# Patient Record
Sex: Male | Born: 2005 | Race: Black or African American | Hispanic: No | Marital: Single | Smoking: Never smoker
Health system: Southern US, Community
[De-identification: ages and names within clinical notes are randomized; demographics above are authoritative.]

## PROBLEM LIST (undated history)

## (undated) DIAGNOSIS — J302 Other seasonal allergic rhinitis: Secondary | ICD-10-CM

## (undated) DIAGNOSIS — L309 Dermatitis, unspecified: Secondary | ICD-10-CM

## (undated) HISTORY — DX: Dermatitis, unspecified: L30.9

## (undated) HISTORY — DX: Other seasonal allergic rhinitis: J30.2

---
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
## 2023-07-14 ENCOUNTER — Telehealth (INDEPENDENT_AMBULATORY_CARE_PROVIDER_SITE_OTHER): Payer: Self-pay | Admitting: Primary Care

## 2023-07-14 NOTE — Telephone Encounter (Signed)
 Called pt to remind them about apt. Pt will be present

## 2023-07-15 ENCOUNTER — Other Ambulatory Visit (HOSPITAL_COMMUNITY)
Admission: RE | Admit: 2023-07-15 | Discharge: 2023-07-15 | Disposition: A | Discharge status: Home / Self Care | Payer: Managed Care, Other (non HMO) | Source: Ambulatory Visit | Attending: Primary Care | Admitting: Primary Care

## 2023-07-15 ENCOUNTER — Encounter (INDEPENDENT_AMBULATORY_CARE_PROVIDER_SITE_OTHER): Payer: Self-pay | Admitting: Primary Care

## 2023-07-15 ENCOUNTER — Ambulatory Visit (INDEPENDENT_AMBULATORY_CARE_PROVIDER_SITE_OTHER): Payer: Managed Care, Other (non HMO) | Admitting: Primary Care

## 2023-07-15 VITALS — BP 120/72 | HR 64 | Resp 16 | Ht 71.0 in | Wt 140.8 lb

## 2023-07-15 DIAGNOSIS — Z23 Encounter for immunization: Secondary | ICD-10-CM

## 2023-07-15 DIAGNOSIS — Z113 Encounter for screening for infections with a predominantly sexual mode of transmission: Secondary | ICD-10-CM | POA: Diagnosis not present

## 2023-07-15 DIAGNOSIS — R1033 Periumbilical pain: Secondary | ICD-10-CM

## 2023-07-15 DIAGNOSIS — Z2821 Immunization not carried out because of patient refusal: Secondary | ICD-10-CM

## 2023-07-15 DIAGNOSIS — Z7689 Persons encountering health services in other specified circumstances: Secondary | ICD-10-CM | POA: Diagnosis not present

## 2023-07-15 DIAGNOSIS — K219 Gastro-esophageal reflux disease without esophagitis: Secondary | ICD-10-CM

## 2023-07-15 MED ORDER — METOCLOPRAMIDE HCL 10 MG PO TABS: 10.0000 mg | ORAL_TABLET | Freq: Three times a day (TID) | ORAL | 0 refills | Status: AC | PRN

## 2023-07-15 NOTE — Progress Notes (Signed)
New Patient Office Visit  Subjective    Patient ID: Charles Walter male  DOB: 03-25-2006  Age: 17 y.o. MRN: [id number]   CC: abdominal pain   Charles Walter is a 17 year old male who is sexually active with the same male for the last 2 years.  Asked patient what was use for birth control he states she is on the pill.  This is when the conversation starts-the pill that is only active if you remember to take it daily, the pill is also decreased in effectiveness if on an antibiotic.  He states she has an alarm to take it every morning at 10:30 AM.  Explained to patient patient this is very good however condoms is also an additional option for birth control and decreases the risk for sexually transmitted diseases.  Patient is aware of all discussed and is in agreement.  Gastroesophageal Reflux He complains of abdominal pain and heartburn. This is a chronic problem. The current episode started more than 1 year ago. The problem occurs frequently. The problem has been waxing and waning. The heartburn duration is several minutes. The heartburn is located in the substernum and left chest. The heartburn is of mild intensity. The heartburn does not wake him from sleep. The heartburn does not limit his activity. The heartburn changes with position. The symptoms are aggravated by smoking, caffeine and certain foods (constipation). Risk factors include smoking/tobacco exposure and NSAIDs. He has tried nothing for the symptoms. The treatment provided mild relief.      Current Outpatient Medications on File Prior to Visit  Medication Sig Dispense Refill   fluticasone (FLONASE) 50 MCG/ACT nasal spray Place 1 spray into both nostrils daily. 16 g 6   loratadine (CLARITIN) 10 MG tablet Take 1 tablet (10 mg total) by mouth daily.     No current facility-administered medications on file prior to visit.     No Known Allergies  Past Medical History:  Diagnosis Date   Eczema    Seasonal allergies       No past surgical history on file.   Family History  Problem Relation Age of Onset   Asthma Brother    Hypertension Maternal Grandmother     Social History   Socioeconomic History   Marital status: Single    Spouse name: Not on file   Number of children: Not on file   Years of education: Not on file   Highest education level: Not on file  Occupational History   Not on file  Tobacco Use   Smoking status: Never   Smokeless tobacco: Never  Substance and Sexual Activity   Alcohol use: Never    Alcohol/week: 0.0 standard drinks of alcohol   Drug use: Never   Sexual activity: Never  Other Topics Concern   Not on file  Social History Narrative   Lives with mother and brothers; Charles Walter and Charles Walter.    Social Drivers of Corporate investment banker Strain: Not on file  Food Insecurity: Not on file  Transportation Needs: Not on file  Physical Activity: Not on file  Stress: Not on file  Social Connections: Not on file  Intimate Partner Violence: Not on file       Health Maintenance  Topic Date Due   HPV Vaccine (1 - Male 3-dose series) Never done   Hepatitis C Screening  Never done   COVID-19 Vaccine (1 - 2024-25 season) Never done   Flu Shot  10/27/2023*  DTaP/Tdap/Td vaccine (2 - Td or Tdap) 04/04/2026   HIV Screening  Completed  *Topic was postponed. The date shown is not the original due date.    Objective    BP 120/72   Pulse 64   Resp 16   Ht 5\' 11"  (1.803 m)   Wt 140 lb 12.8 oz (63.9 kg)   SpO2 96%   BMI 19.64 kg/m   Physical exam: General: Vital signs reviewed.  Patient is well-developed and well-nourished, male in no acute distress and cooperative with exam. Head: Normocephalic and atraumatic. Eyes: EOMI, conjunctivae normal, no scleral icterus. Neck: Supple, trachea midline, normal ROM, no JVD, masses, thyromegaly, or carotid bruit present. Cardiovascular: RRR, S1 normal, S2 normal, no murmurs, gallops, or rubs. Pulmonary/Chest:  Clear to auscultation bilaterally, no wheezes, rales, or rhonchi. Abdominal: Soft, non-tender, non-distended, BS +, no masses, organomegaly, or guarding present. Musculoskeletal: No joint deformities, erythema, or stiffness, ROM full and nontender. Extremities: No lower extremity edema bilaterally,  pulses symmetric and intact bilaterally. No cyanosis or clubbing. Neurological: A&O x3, Strength is normal Skin: Warm, dry and intact. No rashes or erythema. Psychiatric: Normal mood and affect. speech and behavior is normal. Cognition and memory are normal.     Assessment & Plan:   Charles Walter was seen today for new patient (initial visit) and gastroesophageal reflux.  Diagnoses and all orders for this visit:  Screening examination for STD (sexually transmitted disease)  Influenza vaccination declined     Follow-up:  Return in about 3 months (around 10/13/2023) for medical conditions.  The above assessment and management plan was discussed with the patient. The patient verbalized understanding of and has agreed to the management plan. Patient is aware to call the clinic if symptoms fail to improve or worsen. Patient is aware when to return to the clinic for a follow-up visit. Patient educated on when it is appropriate to go to the emergency department.   Gwinda Passe, NP-C

## 2023-07-15 NOTE — Patient Instructions (Signed)
 HPV Vaccine Information for Parents  Human papillomavirus (HPV) is a common virus. It spreads easily from person to person through skin-to-skin or sexual contact. There are many types of HPV viruses. Genital or mucosal HPV can cause warts in the genitals. Cutaneous or nonmucosal HPV can cause warts on the hands or feet. Some genital HPV types may cause cancer. Your child can get a shot to help prevent the HPV types that can cause cancer, genital warts, or warts near the opening of the butt (anus). The vaccine is safe and effective. It is recommended that your child get the vaccine at about 3-41 years of age. Getting the vaccine before your child is sexually active gives them the best protection from HPV through adulthood. How can HPV affect my child? An infection with HPV can cause: Genital warts. Mouth or throat cancer. Cancer of the anus. Cancer of the lowest part of the uterus (cervix), outer male genital area (vulva), or vagina. Cancer of the penis (penile cancer). During pregnancy, HPV can be passed to the baby. This infection can cause warts to form in the baby's throat and mouth. What actions can I take to lower my child's risk for HPV? Have your child get the HPV vaccine before they become sexually active. The best time to get the shot is at around 76-94 years of age. The vaccine may be given to children as young as 67 years old.  If your child gets the vaccine before they are 17 years old, it can be given as 2 shots, 6-12 months apart. In some cases, 3 doses are needed. Your child may need 3 doses if: They get the first dose before they are 19 years old but do not have a second dose within 6-12 months after the first dose. They get their first dose after they are 19 years old. They will need to get the other 2 doses within 6 months of the first dose. They have a weak body defense system (immune system). What are the risks and benefits of the HPV vaccine? Benefits Getting the vaccine  can help prevent certain cancers. These include: Oral cancer. This is cancer of the mouth. Anal cancer. This is cancer of the anus. Cancers of the cervix, vulva, and vagina in females. Penile cancer in males. Your child is less likely to get these cancers if they get the vaccine before they become sexually active. The vaccine also prevents genital warts caused by HPV. Risks In rare cases, side effects and reactions have been reported. These include: Soreness, redness, or swelling at the injection site. Dizziness or fainting. Fever. Headache. Muscle or joint pain. Who should not get the HPV vaccine or wait to get it? Some children should not get the HPV vaccine or should wait to get it. Ask the health care provider if your child should get the vaccine if: Your child has had a severe allergic reaction to other vaccines. Your child is allergic to yeast. Your child has a fever. Your child has had a recent illness. Your child is pregnant or may be pregnant. Where to find more information Centers for Disease Control and Prevention (CDC): TonerProm[employer].no American Academy of Pediatrics (AAP): healthychildren.org This information is not intended to replace advice given to you by your health care provider. Make sure you discuss any questions you have with your health care provider. Document Revised: 04/12/2022 Document Reviewed: 04/12/2022 Elsevier Patient Education  2024 [employer].

## 2023-07-16 LAB — CMP14+EGFR
ALT: 21 [IU]/L (ref 0–44)
AST: 28 [IU]/L (ref 0–40)
Albumin: 4.8 g/dL (ref 4.3–5.2)
Alkaline Phosphatase: 94 [IU]/L (ref 51–125)
BUN/Creatinine Ratio: 20 (ref 9–20)
BUN: 18 mg/dL (ref 6–20)
Bilirubin Total: 0.7 mg/dL (ref 0.0–1.2)
CO2: 23 mmol/L (ref 20–29)
Calcium: 9.6 mg/dL (ref 8.7–10.2)
Chloride: 101 mmol/L (ref 96–106)
Creatinine, Ser: 0.89 mg/dL (ref 0.76–1.27)
Globulin, Total: 2.1 g/dL (ref 1.5–4.5)
Glucose: 65 mg/dL — ABNORMAL LOW (ref 70–99)
Potassium: 3.8 mmol/L (ref 3.5–5.2)
Sodium: 141 mmol/L (ref 134–144)
Total Protein: 6.9 g/dL (ref 6.0–8.5)
eGFR: 127 mL/min/{1.73_m2} (ref >59)

## 2023-07-16 LAB — CBC WITH DIFFERENTIAL/PLATELET
Basophils Absolute: 0.1 10*3/uL (ref 0.0–0.2)
Basos: 1 %
EOS (ABSOLUTE): 0.3 10*3/uL (ref 0.0–0.4)
Eos: 5 %
Hematocrit: 44.8 % (ref 37.5–51.0)
Hemoglobin: 14.7 g/dL (ref 13.0–17.7)
Immature Grans (Abs): 0 10*3/uL (ref 0.0–0.1)
Immature Granulocytes: 0 %
Lymphocytes Absolute: 2.1 10*3/uL (ref 0.7–3.1)
Lymphs: 33 %
MCH: 29.8 pg (ref 26.6–33.0)
MCHC: 32.8 g/dL (ref 31.5–35.7)
MCV: 91 fL (ref 79–97)
Monocytes Absolute: 0.7 10*3/uL (ref 0.1–0.9)
Monocytes: 11 %
Neutrophils Absolute: 3.2 10*3/uL (ref 1.4–7.0)
Neutrophils: 50 %
Platelets: 295 10*3/uL (ref 150–450)
RBC: 4.93 x10E6/uL (ref 4.14–5.80)
RDW: 11.8 % (ref 11.6–15.4)
WBC: 6.4 10*3/uL (ref 3.4–10.8)

## 2023-07-16 LAB — URINE CYTOLOGY ANCILLARY ONLY
Chlamydia: NEGATIVE
Comment: NEGATIVE
Comment: NEGATIVE
Comment: NORMAL
Neisseria Gonorrhea: NEGATIVE
Trichomonas: NEGATIVE

## 2023-08-18 ENCOUNTER — Ambulatory Visit (INDEPENDENT_AMBULATORY_CARE_PROVIDER_SITE_OTHER): Payer: Managed Care, Other (non HMO)

## 2023-08-19 ENCOUNTER — Ambulatory Visit (INDEPENDENT_AMBULATORY_CARE_PROVIDER_SITE_OTHER): Payer: Managed Care, Other (non HMO)

## 2023-10-14 ENCOUNTER — Ambulatory Visit (INDEPENDENT_AMBULATORY_CARE_PROVIDER_SITE_OTHER): Payer: Managed Care, Other (non HMO) | Admitting: Primary Care

## 2023-10-28 ENCOUNTER — Ambulatory Visit (INDEPENDENT_AMBULATORY_CARE_PROVIDER_SITE_OTHER): Admitting: Primary Care

## 2023-10-29 ENCOUNTER — Telehealth (INDEPENDENT_AMBULATORY_CARE_PROVIDER_SITE_OTHER): Admitting: Primary Care

## 2024-01-14 ENCOUNTER — Ambulatory Visit (INDEPENDENT_AMBULATORY_CARE_PROVIDER_SITE_OTHER): Payer: Self-pay

## 2024-01-14 DIAGNOSIS — Z23 Encounter for immunization: Secondary | ICD-10-CM

## 2024-01-16 NOTE — Progress Notes (Cosign Needed)
 Pt came into the office for 2nd hpv vaccine  Pt tolerated well

## 2024-02-24 ENCOUNTER — Telehealth (INDEPENDENT_AMBULATORY_CARE_PROVIDER_SITE_OTHER): Payer: Self-pay | Admitting: Primary Care

## 2024-02-24 NOTE — Telephone Encounter (Signed)
 Called pt to confirm appt. Pt will be present.

## 2024-02-25 ENCOUNTER — Encounter (INDEPENDENT_AMBULATORY_CARE_PROVIDER_SITE_OTHER): Payer: Self-pay | Admitting: Primary Care

## 2024-02-25 ENCOUNTER — Ambulatory Visit (INDEPENDENT_AMBULATORY_CARE_PROVIDER_SITE_OTHER): Admitting: Primary Care

## 2024-02-25 VITALS — BP 108/59 | HR 84 | Resp 16 | Ht 71.5 in | Wt 140.4 lb

## 2024-02-25 DIAGNOSIS — M546 Pain in thoracic spine: Secondary | ICD-10-CM | POA: Diagnosis not present

## 2024-02-25 DIAGNOSIS — M25511 Pain in right shoulder: Secondary | ICD-10-CM | POA: Diagnosis not present

## 2024-02-25 DIAGNOSIS — F419 Anxiety disorder, unspecified: Secondary | ICD-10-CM

## 2024-02-25 DIAGNOSIS — R079 Chest pain, unspecified: Secondary | ICD-10-CM

## 2024-02-25 DIAGNOSIS — M25512 Pain in left shoulder: Secondary | ICD-10-CM

## 2024-02-25 DIAGNOSIS — R058 Other specified cough: Secondary | ICD-10-CM

## 2024-02-25 MED ORDER — GUAIFENESIN ER 600 MG PO TB12
600.0000 mg | ORAL_TABLET | Freq: Two times a day (BID) | ORAL | 1 refills | Status: AC
Start: 2024-02-25 — End: ?

## 2024-02-25 MED ORDER — IBUPROFEN 600 MG PO TABS: 600.0000 mg | ORAL_TABLET | Freq: Three times a day (TID) | ORAL | 0 refills | Status: AC | PRN

## 2024-02-25 NOTE — Progress Notes (Signed)
 [facility]  Kobie Whidby, is a 18 y.o. male  RDW:[id number]  FMW:[id number]  DOB - [birth year]  Chest pain       Subjective:   Charles Walter is a 18 y.o. male here today for an acute visit.  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  Likely causes of pain is due to lifting, pulling and reaching repetitive motion. Chest Pain  This is a recurrent problem. The current episode started more than 1 year ago. The onset quality is sudden. The problem occurs 2 to 4 times per day. The problem has been gradually worsening. The pain is present in the substernal region. The pain is at a severity of 6/10 (deep breathing or raising left arm shoulder blade). The quality of the pain is described as stabbing. The pain does not radiate. Associated symptoms include back pain and a cough. Associated symptoms comments: Palpitation Emotional stress. The cough's precipitants include smoke. The cough is Productive. There is no color change associated with the cough. The cough is relieved by one or more OTC medications. The cough is worsened by smoke exposure. The pain is aggravated by deep breathing and coughing. Treatments tried: Robitussin. The treatment provided moderate relief. Risk factors include male gender and smoking/tobacco exposure.  Back Pain This is a new problem. The current episode started more than 1 month ago. The problem occurs intermittently (working pulling pallets). The pain is present in the thoracic spine (shoulder). The quality of the pain is described as stabbing. The pain does not radiate. The pain is at a severity of 7/10. The pain is moderate. The pain is The same all the time. The symptoms are aggravated by bending, position and twisting. Associated symptoms include chest pain. He has tried NSAIDs for the symptoms. The treatment provided moderate relief.    No problems updated.  Comprehensive  ROS Pertinent positive and negative noted in HPI   No Known Allergies  Past Medical History:  Diagnosis Date   Eczema    Seasonal allergies     Current Outpatient Medications on File Prior to Visit  Medication Sig Dispense Refill   fluticasone  (FLONASE ) 50 MCG/ACT nasal spray Place 1 spray into both nostrils daily. 16 g 6   loratadine  (CLARITIN ) 10 MG tablet Take 1 tablet (10 mg total) by mouth daily.     metoCLOPramide  (REGLAN ) 10 MG tablet Take 1 tablet (10 mg total) by mouth every 8 (eight) hours as needed for nausea. 120 tablet 0   No current facility-administered medications on file prior to visit.   Health Maintenance  Topic Date Due   Meningitis B Vaccine (1 of 2 - Standard) Never done   Hepatitis C Screening  Never done   Hepatitis B Vaccine (1 of 3 - 19+ 3-dose series) Never done   COVID-19 Vaccine (1 - 2024-25 season) Never done   HPV Vaccine (3 - Male 3-dose series) 04/07/2024   Flu Shot  02/27/2024   DTaP/Tdap/Td vaccine (2 - Td or Tdap) 04/04/2026   HIV Screening  Completed    Objective:  Karson was seen today for chest pain and back pain.  Diagnoses and all orders for this visit:  Chest pain, unspecified type -     EKG 12-Lead  Acute pain of both shoulders  Anxiety Does not feel it interferes with life and not interested in medication  Acute bilateral thoracic back pain        Physical  Exam Vitals reviewed.  Constitutional:      Appearance: Normal appearance. He is normal weight.  HENT:     Head: Normocephalic.     Right Ear: Tympanic membrane, ear canal and external ear normal.     Left Ear: Tympanic membrane, ear canal and external ear normal.     Nose: Nose normal.  Eyes:     Extraocular Movements: Extraocular movements intact.     Pupils: Pupils are equal, round, and reactive to light.  Cardiovascular:     Rate and Rhythm: Normal rate and regular rhythm.  Pulmonary:     Effort: Pulmonary effort is normal.     Breath sounds: Normal  breath sounds.  Abdominal:     General: Bowel sounds are normal. There is distension.     Palpations: Abdomen is soft.  Musculoskeletal:        General: Normal range of motion.     Cervical back: Normal range of motion.  Skin:    General: Skin is warm and dry.  Neurological:     Mental Status: He is alert and oriented to person, place, and time.  Psychiatric:        Mood and Affect: Mood normal.        Behavior: Behavior normal.        Thought Content: Thought content normal.        Judgment: Judgment normal.       Assessment & Plan   Chest pain, unspecified type Sinus Bradycardia  -RSR(V1) -probably normal for age. PROBABLY NORMAL FOR AGE -     EKG 12-Lead  Productive cough   Guaifenesin  600mg  bid     Patient have been counseled extensively about nutrition and exercise. Other issues discussed during this visit include: low cholesterol diet, weight control and daily exercise, foot care, annual eye examinations at Ophthalmology, importance of adherence with medications and regular follow-up. We also discussed long term complications of uncontrolled diabetes and hypertension.   Return if symptoms worsen or fail to improve.  The patient was given clear instructions to go to ER or return to medical center if symptoms don't improve, worsen or new problems develop. The patient verbalized understanding. The patient was told to call to get lab results if they haven't heard anything in the next week.   This note has been created with Education officer, environmental. Any transcriptional errors are unintentional.   Rosaline SHAUNNA Bohr, NP 02/25/2024, 3:08 PM

## 2024-02-25 NOTE — Patient Instructions (Signed)
Acute Pain, Adult Acute pain is a type of sudden pain that may last for just a few days or for as long as three months. It is often related to an illness, injury, or a medical procedure. Acute pain may be mild, moderate, or severe. Pain can make it hard for you to do your daily activities. It can cause anxiety and lead to other problems if it is not treated. Treatment may not take all the pain away, but it may lessen the pain so you can move around and tolerate it. Pain is best treated with medicines and other therapies such as distraction, meditation, oils from plants (aromatherapy), heat, and ice. Treatment depends on the cause of the pain and how severe it is. Acute pain usually goes away once your injury has healed or you are no longer ill. Follow these instructions at home: Medicines  Take over-the-counter and prescription medicines only as told by your health care provider. Take the lowest dose of medicine for the shortest amount of time needed to relieve the pain. If you are taking prescription pain medicine: Do not stop taking the medicine suddenly. Talk to your health care provider about how and when to stop taking prescription medicine. Do not take more pills than told by your health care provider even if your pain is severe. Do not take other over-the-counter pain medicines in addition to prescription pain medicine unless told by your health care provider. Keep your medicine in a safe place, away from children or anyone who could use it in a way that it was not prescribed. Ask your health care provider if the medicine prescribed to you requires you to avoid driving or using machinery. Managing pain, stiffness, and swelling     If told, put ice on the affected area. Put ice in a plastic bag. Place a towel between your skin and the bag. Leave the ice on for 20 minutes, 2-3 times a day. If told, apply heat to the affected area as often as told by your health care provider. Use the heat  source that your health care provider recommends, such as a moist heat pack or a heating pad. Place a towel between your skin and the heat source. Leave the heat on for 20-30 minutes. If your skin turns bright red, remove the ice or heat right away to prevent skin damage. The risk of damage is higher if you cannot feel pain, heat, or cold. Managing constipation Your medicines may cause constipation. To prevent or treat constipation, you may need to: Drink enough fluid to keep your urine pale yellow. Take over-the-counter or prescription medicines. Eat foods that are high in fiber, such as beans, whole grains, and fresh fruits and vegetables. Limit foods that are high in fat and processed sugars, such as fried or sweet foods. Activity Rest as told by your health care provider. Return to your normal activities as told by your health care provider. Ask your health care provider what activities are safe for you. Ask your health care provider if doing physical therapy exercises to improve movement and strength can help you manage your pain. General instructions Check your pain level as told by your health care provider. Ask your health care provider if distraction, relaxation, or aromatherapy can help you manage your pain. Keep all follow-up visits. Your health care provider will monitor your pain level. Contact a health care provider if: Your pain is not controlled by medicine. Your pain does not improve or gets worse. You have  side effects from pain medicines. Get help right away if: You have severe pain. You have trouble breathing. You faint, or another person sees you faint. You have chest pain or pressure that lasts for more than a few minutes, or if you have other symptoms along with chest pain, including: Pain or discomfort in one or both arms, your back, neck, jaw, or stomach. Shortness of breath. A cold sweat. Nausea. Feeling light-headed. These symptoms may be an emergency. Get  help right away. Call 911. Do not wait to see if the symptoms will go away. Do not drive yourself to the hospital. This information is not intended to replace advice given to you by your health care provider. Make sure you discuss any questions you have with your health care provider. Document Revised: 02/06/2022 Document Reviewed: 02/06/2022 Elsevier Patient Education  2024 [employer].

## 2024-05-03 ENCOUNTER — Ambulatory Visit (INDEPENDENT_AMBULATORY_CARE_PROVIDER_SITE_OTHER): Admitting: Primary Care

## 2024-07-13 ENCOUNTER — Telehealth (INDEPENDENT_AMBULATORY_CARE_PROVIDER_SITE_OTHER): Payer: Self-pay | Admitting: Primary Care

## 2024-07-13 NOTE — Telephone Encounter (Signed)
 Called pt to reschedule appt. Pt did not answer and LVM for pt to return call so that we can reschedule appt.

## 2024-07-14 ENCOUNTER — Ambulatory Visit (INDEPENDENT_AMBULATORY_CARE_PROVIDER_SITE_OTHER): Admitting: Primary Care
